# Patient Record
Sex: Male | Born: 1965 | Race: Black or African American | Hispanic: No | Marital: Single | Smoking: Current every day smoker
Health system: Southern US, Community
[De-identification: ages and names within clinical notes are randomized; demographics above are authoritative.]

## PROBLEM LIST (undated history)

## (undated) DIAGNOSIS — E119 Type 2 diabetes mellitus without complications: Secondary | ICD-10-CM

---

## 2006-03-13 ENCOUNTER — Ambulatory Visit: Payer: Self-pay | Admitting: Family Medicine

## 2006-06-14 ENCOUNTER — Ambulatory Visit: Payer: Self-pay | Admitting: Family Medicine

## 2012-06-05 ENCOUNTER — Emergency Department (HOSPITAL_COMMUNITY)
Admission: EM | Admit: 2012-06-05 | Discharge: 2012-06-05 | Disposition: A | Discharge status: Home / Self Care | Payer: 59 | Attending: Emergency Medicine | Admitting: Emergency Medicine

## 2012-06-05 ENCOUNTER — Encounter (HOSPITAL_COMMUNITY): Payer: Self-pay | Admitting: Emergency Medicine

## 2012-06-05 ENCOUNTER — Emergency Department (HOSPITAL_COMMUNITY): Payer: 59

## 2012-06-05 DIAGNOSIS — S8990XA Unspecified injury of unspecified lower leg, initial encounter: Secondary | ICD-10-CM | POA: Insufficient documentation

## 2012-06-05 DIAGNOSIS — X58XXXA Exposure to other specified factors, initial encounter: Secondary | ICD-10-CM | POA: Insufficient documentation

## 2012-06-05 DIAGNOSIS — Y929 Unspecified place or not applicable: Secondary | ICD-10-CM | POA: Insufficient documentation

## 2012-06-05 DIAGNOSIS — Y9389 Activity, other specified: Secondary | ICD-10-CM | POA: Insufficient documentation

## 2012-06-05 DIAGNOSIS — M25561 Pain in right knee: Secondary | ICD-10-CM

## 2012-06-05 DIAGNOSIS — E119 Type 2 diabetes mellitus without complications: Secondary | ICD-10-CM | POA: Insufficient documentation

## 2012-06-05 DIAGNOSIS — IMO0002 Reserved for concepts with insufficient information to code with codable children: Secondary | ICD-10-CM | POA: Insufficient documentation

## 2012-06-05 DIAGNOSIS — S46911A Strain of unspecified muscle, fascia and tendon at shoulder and upper arm level, right arm, initial encounter: Secondary | ICD-10-CM

## 2012-06-05 DIAGNOSIS — F17200 Nicotine dependence, unspecified, uncomplicated: Secondary | ICD-10-CM | POA: Insufficient documentation

## 2012-06-05 HISTORY — DX: Type 2 diabetes mellitus without complications: E11.9

## 2012-06-05 MED ORDER — NAPROXEN 500 MG PO TABS: 500.0000 mg | ORAL_TABLET | Freq: Two times a day (BID) | ORAL | Status: DC

## 2012-06-05 MED ORDER — HYDROCODONE-ACETAMINOPHEN 5-325 MG PO TABS: ORAL_TABLET | ORAL | Status: DC

## 2012-06-05 NOTE — ED Notes (Addendum)
Pt c/o right shoulder pain since wrestling around on Saturday. Pt states he can not lift his arm above his head. Pt also c/o right knee pain. nad noted.

## 2012-06-09 NOTE — ED Provider Notes (Signed)
History     CSN: [id number]  Arrival date & time 06/05/12  1026   First MD Initiated Contact with Patient 06/05/12 1122      Chief Complaint  Patient presents with  . Shoulder Injury    (Consider location/radiation/quality/duration/timing/severity/associated sxs/prior treatment) HPI Comments: Patient c/o pain to his right shoulder and right knee for several days.  States the pain began after an injury while "horseplaying".  Pain to the shoulder is worse with movement and improves when at rest.  Knee pain is mild but worse with flexion.  He denies head injury, neck or back pain or LOC. Also denies UE weakness, swelling or numbness.     Patient is a 47 y.o. male presenting with shoulder injury. The history is provided by the patient.  Shoulder Injury This is a new problem. The current episode started in the past 7 days. The problem occurs constantly. The problem has been unchanged. Associated symptoms include arthralgias. Pertinent negatives include no chest pain, chills, diaphoresis, fatigue, fever, headaches, joint swelling, myalgias, nausea, neck pain, numbness, sore throat, swollen glands, vomiting or weakness. Exacerbated by: movement and palpation. He has tried nothing for the symptoms. The treatment provided no relief.    Past Medical History  Diagnosis Date  . Diabetes mellitus without complication     History reviewed. No pertinent past surgical history.  History reviewed. No pertinent family history.  History  Substance Use Topics  . Smoking status: Current Every Day Smoker -- 1.00 packs/day    Types: Cigarettes  . Smokeless tobacco: Not on file  . Alcohol Use: No      Review of Systems  Constitutional: Negative for fever, chills, diaphoresis and fatigue.  HENT: Negative for sore throat and neck pain.   Cardiovascular: Negative for chest pain.  Gastrointestinal: Negative for nausea and vomiting.  Genitourinary: Negative for dysuria and difficulty urinating.   Musculoskeletal: Positive for arthralgias. Negative for myalgias and joint swelling.  Skin: Negative for color change and wound.  Neurological: Negative for weakness, numbness and headaches.  All other systems reviewed and are negative.    Allergies  Review of patient's allergies indicates no known allergies.  Home Medications   Current Outpatient Rx  Name  Route  Sig  Dispense  Refill  . HYDROcodone-acetaminophen (NORCO/VICODIN) 5-325 MG per tablet      Take one-two tabs po q 4-6 hrs prn pain   20 tablet   0   . naproxen (NAPROSYN) 500 MG tablet   Oral   Take 1 tablet (500 mg total) by mouth 2 (two) times daily with a meal.   20 tablet   0     BP 130/74  Pulse 78  Temp(Src) 97.9 F (36.6 C) (Oral)  Resp 16  Ht 5\' 9"  (1.753 m)  Wt 205 lb (92.987 kg)  BMI 30.26 kg/m2  SpO2 100%  Physical Exam  Nursing note and vitals reviewed. Constitutional: He is oriented to person, place, and time. He appears well-developed and well-nourished. No distress.  HENT:  Head: Normocephalic and atraumatic.  Neck: Normal range of motion. Neck supple. No thyromegaly present.  Cardiovascular: Normal rate, regular rhythm, normal heart sounds and intact distal pulses.   No murmur heard. Pulmonary/Chest: Effort normal and breath sounds normal. No respiratory distress. He exhibits no tenderness.  Musculoskeletal: He exhibits tenderness. He exhibits no edema.  ttp of the right anterior shoulder.  Pain with abduction of the right arm and rotation of the shoulder.  Radial pulse is brisk, sensation  intact, CR< 2 sec.  No abrasions, edema or deformity of the joint.  Pt has full ROM of the right knee.  No obvious bruising, swelling or bony deformities.  DP pulse brisk  Lymphadenopathy:    He has no cervical adenopathy.  Neurological: He is alert and oriented to person, place, and time. He exhibits normal muscle tone. Coordination normal.  Skin: Skin is warm and dry.    ED Course  Procedures  (including critical care time)  Labs Reviewed - No data to display No results found. Dg Shoulder Right  06/05/2012   *RADIOLOGY REPORT*  Clinical Data: Recent traumatic injury with pain  RIGHT SHOULDER - 2+ VIEW  Comparison: None.  Findings: No acute fracture or dislocation is noted.  The underlying bony thorax is within normal limits.  Mild degenerative changes of the acromioclavicular joint are seen.  IMPRESSION: No acute abnormality noted.   Original Report Authenticated By: Alcide Clever, M.D.   Dg Knee Complete 4 Views Right  06/05/2012   *RADIOLOGY REPORT*  Clinical Data: Pain and swelling  RIGHT KNEE - COMPLETE 4+ VIEW  Comparison: None.  Findings: No acute fracture or dislocation is noted.  No soft tissue abnormality is seen.  IMPRESSION: No acute abnormality noted.   Original Report Authenticated By: Alcide Clever, M.D.    1. Shoulder strain, right, initial encounter   2. Knee pain, right       MDM    Pain is likely related to musculoskeletal injury.  Agrees to RICE therapy and close f/u with orthopedics.  Appears stable for d/c.        Haylynn Pha L. Jailen Lung, PA-C 06/09/12 1400

## 2012-06-10 NOTE — ED Provider Notes (Signed)
Medical screening examination/treatment/procedure(s) were performed by non-physician practitioner and as supervising physician I was immediately available for consultation/collaboration.  Raeford Razor, MD 06/10/12 [phone]

## 2014-03-28 IMAGING — CR DG KNEE COMPLETE 4+V*R*
4 series · 4 of 4 positions shown · non-contrast
Comparison: None.

CLINICAL DATA: Pain and swelling

RIGHT KNEE - COMPLETE 4+ VIEW

[view not recorded (1 of 4)]
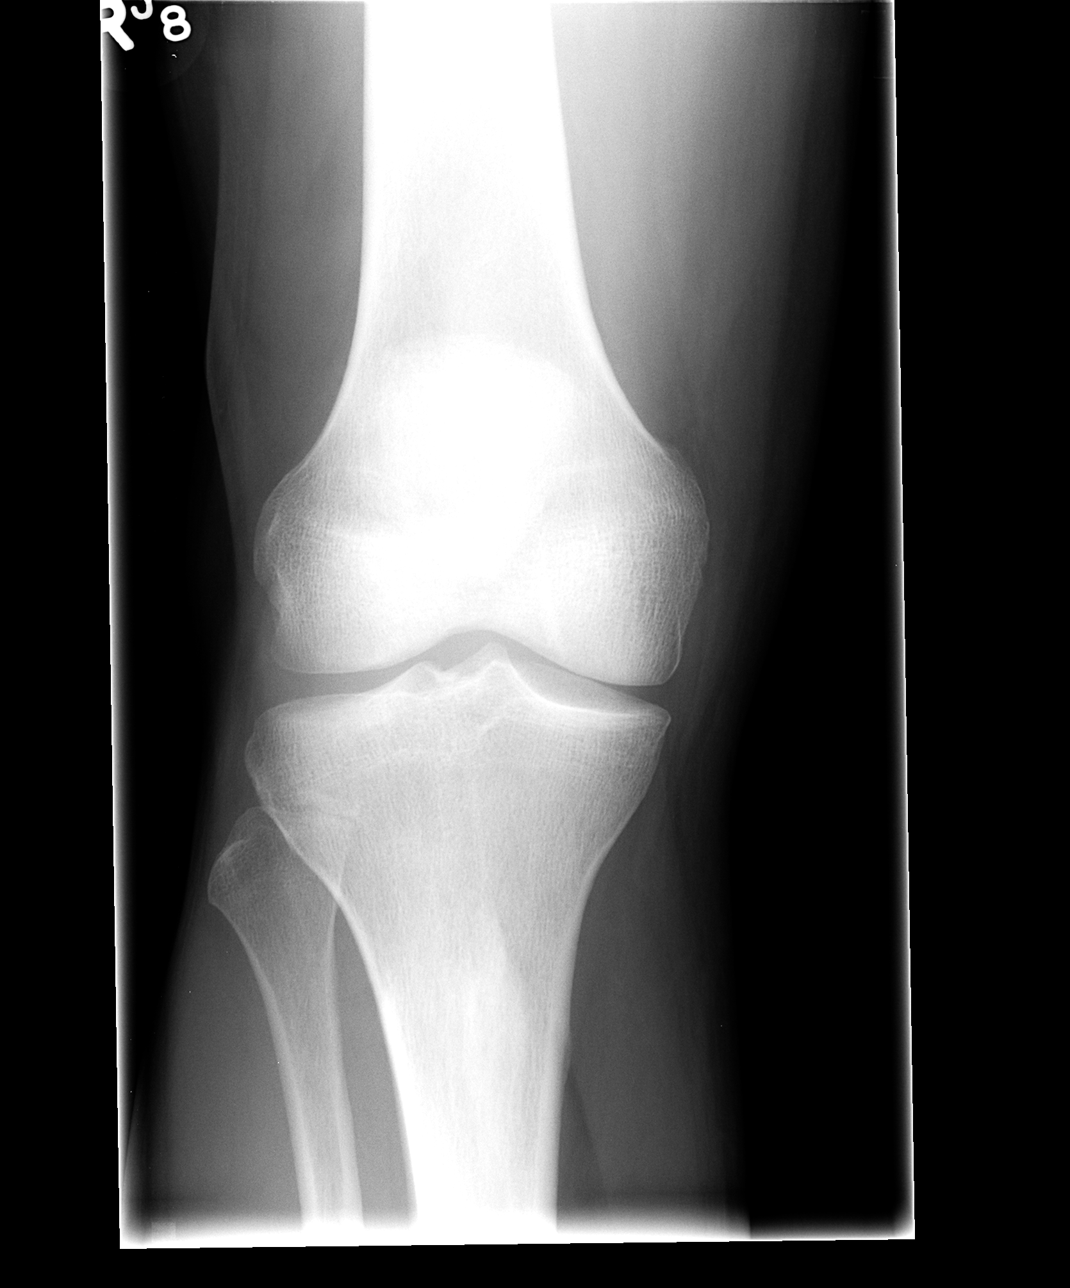

[view not recorded (2 of 4)]
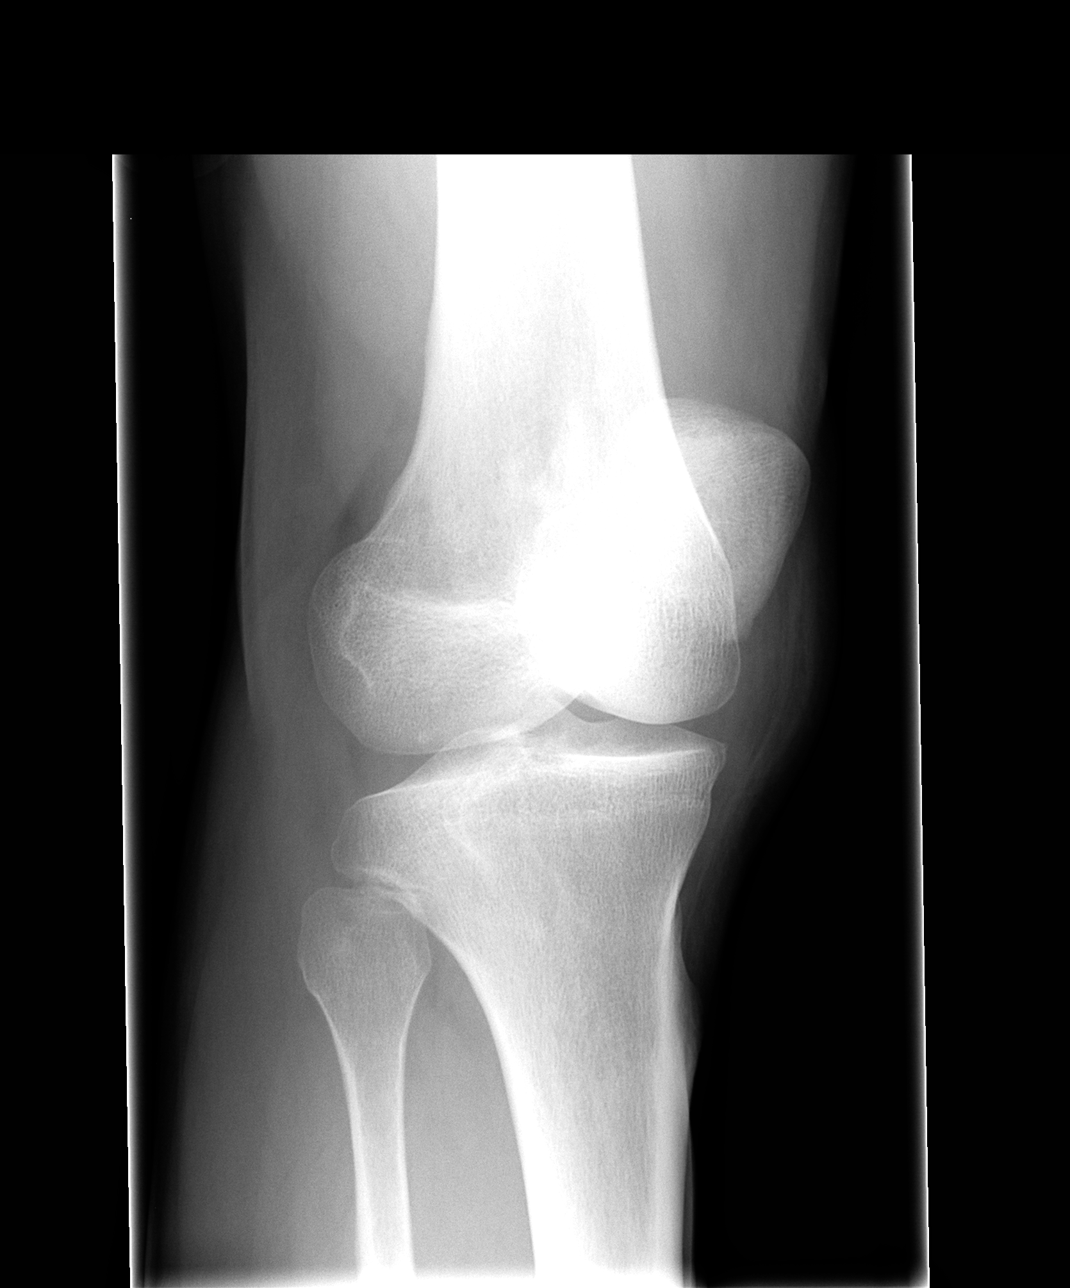

[view not recorded (3 of 4)]
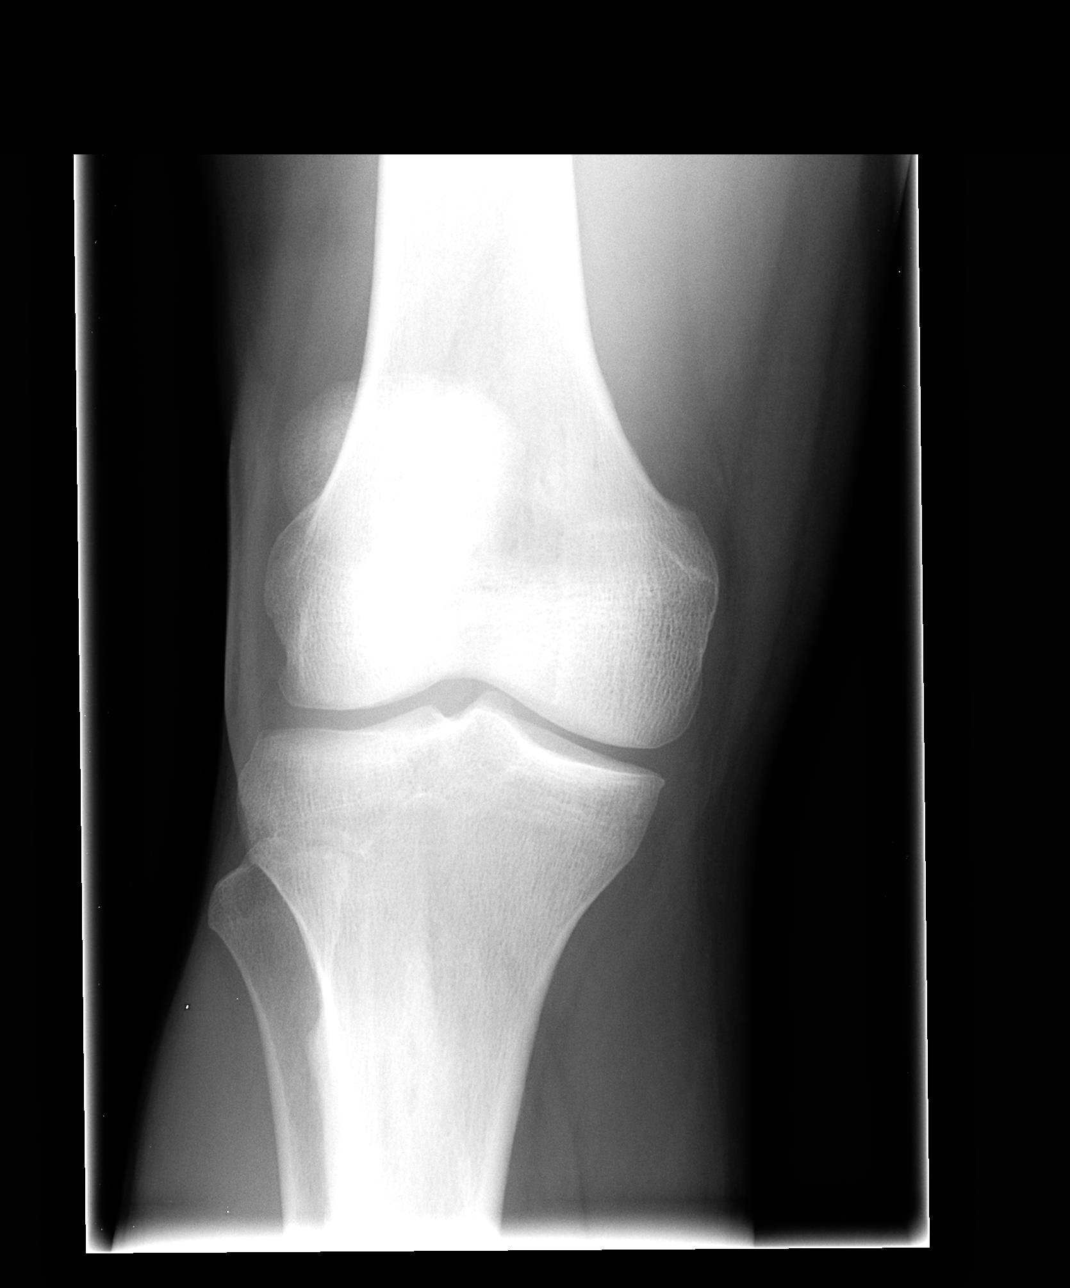

[view not recorded (4 of 4)]
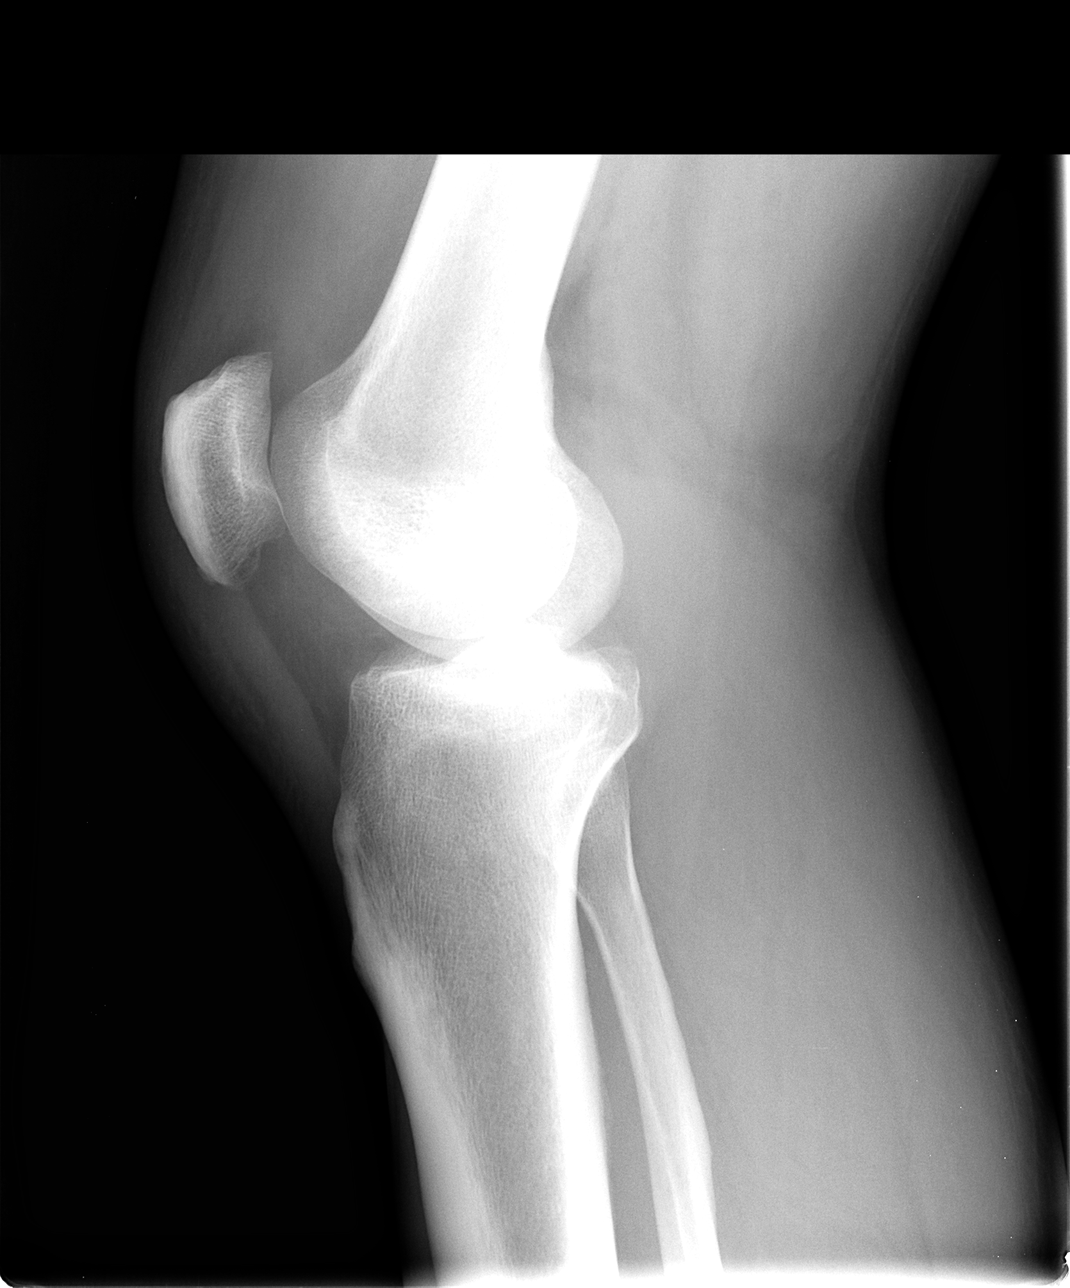

[4 of 4 positions shown; findings below may reference images not displayed]

FINDINGS: No acute fracture or dislocation is noted.  No soft
tissue abnormality is seen.
IMPRESSION: No acute abnormality noted.

## 2020-06-28 ENCOUNTER — Emergency Department (HOSPITAL_COMMUNITY)
Admission: EM | Admit: 2020-06-28 | Discharge: 2020-06-28 | Disposition: A | Discharge status: Home / Self Care | Payer: Worker's Compensation | Attending: Emergency Medicine | Admitting: Emergency Medicine

## 2020-06-28 ENCOUNTER — Other Ambulatory Visit: Payer: Self-pay

## 2020-06-28 DIAGNOSIS — E119 Type 2 diabetes mellitus without complications: Secondary | ICD-10-CM | POA: Diagnosis not present

## 2020-06-28 DIAGNOSIS — Y990 Civilian activity done for income or pay: Secondary | ICD-10-CM | POA: Diagnosis not present

## 2020-06-28 DIAGNOSIS — F17210 Nicotine dependence, cigarettes, uncomplicated: Secondary | ICD-10-CM | POA: Insufficient documentation

## 2020-06-28 DIAGNOSIS — W25XXXA Contact with sharp glass, initial encounter: Secondary | ICD-10-CM | POA: Diagnosis not present

## 2020-06-28 DIAGNOSIS — S61511A Laceration without foreign body of right wrist, initial encounter: Secondary | ICD-10-CM | POA: Insufficient documentation

## 2020-06-28 DIAGNOSIS — S6991XA Unspecified injury of right wrist, hand and finger(s), initial encounter: Secondary | ICD-10-CM | POA: Diagnosis present

## 2020-06-28 MED ORDER — LIDOCAINE HCL (PF) 1 % IJ SOLN
5.0000 mL | Freq: Once | INTRAMUSCULAR | Status: AC
  Administered 2020-06-28: 5 mL
  Filled 2020-06-28: qty 30

## 2020-06-28 NOTE — ED Notes (Signed)
ED Provider at bedside. 

## 2020-06-28 NOTE — ED Triage Notes (Addendum)
Pt c/o laceration to top of right wrist. Bleeding controlled at this time.

## 2020-06-28 NOTE — ED Provider Notes (Signed)
[facility] EMERGENCY DEPARTMENT Provider Note   CSN: [id number] Arrival date & time: 06/28/20  0410     History Chief Complaint  Patient presents with   Laceration    Melvin Davis is a 55 y.o. male.  The history is provided by the patient.  Laceration He has history of diabetes and comes in having suffered a laceration to his right wrist at work.  He states that some [place] was falling and he put his hand up to try to block it and suffered a laceration.  Last tetanus immunization was 3 years ago.  He denies other injury.   Past Medical History:  Diagnosis Date   Diabetes mellitus without complication (HCC)     There are no problems to display for this patient.   No past surgical history on file.     No family history on file.  Social History   Tobacco Use   Smoking status: Every Day    Packs/day: 1.00    Pack years: 0.00    Types: Cigarettes  Substance Use Topics   Alcohol use: No   Drug use: No    Home Medications Prior to Admission medications   Medication Sig Start Date End Date Taking? Authorizing Provider  HYDROcodone-acetaminophen (NORCO/VICODIN) 5-325 MG per tablet Take one-two tabs po q 4-6 hrs prn pain 06/05/12   Triplett, Tammy, PA-C  naproxen (NAPROSYN) 500 MG tablet Take 1 tablet (500 mg total) by mouth 2 (two) times daily with a meal. 06/05/12   Triplett, Tammy, PA-C    Allergies    Patient has no known allergies.  Review of Systems   Review of Systems  All other systems reviewed and are negative.  Physical Exam Updated Vital Signs BP (!) 165/94   Pulse 81   Temp 98.9 F (37.2 C) (Oral)   Resp 18   Ht 5\' 8"  (1.727 m)   Wt 70.3 kg   SpO2 100%   BMI 23.57 kg/m   Physical Exam Vitals and nursing note reviewed.  55 year old male, resting comfortably and in no acute distress. Vital signs are significant for elevated blood pressure. Oxygen saturation is 100%, which is normal. Head is normocephalic and atraumatic.  Neck is nontender  and supple . Back is nontender and there is no CVA tenderness. Lungs are clear without rales, wheezes, or rhonchi. Chest is nontender. Heart has regular rate and rhythm without murmur. Abdomen is soft, flat, nontender. Extremities: Laceration present on the dorsal aspect of the right wrist, oriented transversely.  No evidence of any tendon injury.  Normal strength of extension of all fingers of the right hand.  Normal sensation distal to the laceration. Skin is warm and dry without rash. Neurologic: Mental status is normal, cranial nerves are intact, moves all extremities equally.  ED Results / Procedures / Treatments    Procedures .57Laceration Repair  Date/Time: 06/28/2020 6:01 AM Performed by: 06/30/2020, MD Authorized by: Dione Booze, MD   Consent:    Consent obtained:  Verbal   Consent given by:  Patient   Risks discussed:  Infection and pain   Alternatives discussed:  No treatment Universal protocol:    Procedure explained and questions answered to patient or proxy's satisfaction: yes     Relevant documents present and verified: yes     Required blood products, implants, devices, and special equipment available: yes     Site/side marked: yes     Immediately prior to procedure, a time out was called: yes  Patient identity confirmed:  Verbally with patient and arm band Anesthesia:    Anesthesia method:  Local infiltration   Local anesthetic:  Lidocaine 1% w/o epi Laceration details:    Location:  Shoulder/arm   Shoulder/arm location:  R lower arm   Length (cm):  2   Depth (mm):  2 Pre-procedure details:    Preparation:  Patient was prepped and draped in usual sterile fashion Exploration:    Limited defect created (wound extended): no     Hemostasis achieved with:  Direct pressure   Wound exploration: wound explored through full range of motion and entire depth of wound visualized     Wound extent: no foreign bodies/material noted and no tendon damage noted      Contaminated: no   Treatment:    Area cleansed with:  Saline   Amount of cleaning:  Standard   Debridement:  None   Undermining:  None   Scar revision: no   Skin repair:    Repair method:  Sutures   Suture size:  4-0   Suture material:  Nylon   Suture technique:  Simple interrupted   Number of sutures:  3 Approximation:    Approximation:  Close Repair type:    Repair type:  Simple Post-procedure details:    Dressing:  Antibiotic ointment and sterile dressing   Procedure completion:  Tolerated well, no immediate complications   Medications Ordered in ED Medications  lidocaine (PF) (XYLOCAINE) 1 % injection 5 mL (has no administration in time range)    ED Course  I have reviewed the triage vital signs and the nursing notes.  MDM Rules/Calculators/A&P                         Laceration of the right wrist closed with sutures.  Old records reviewed, and he has no relevant past visits.  Discharged with routine wound care instructions, told to sutures removed in 7-10 days.  Final Clinical Impression(s) / ED Diagnoses Final diagnoses:  Laceration of skin of right wrist, initial encounter    Rx / DC Orders ED Discharge Orders     None        Dione Booze, MD 06/28/20 [phone]

## 2021-07-09 DEATH — deceased
